# Patient Record
Sex: Male | Born: 1965 | Race: White | Hispanic: No | Marital: Married | Smoking: Never smoker
Health system: Southern US, Community
[De-identification: ages and names within clinical notes are randomized; demographics above are authoritative.]

## PROBLEM LIST (undated history)

## (undated) DIAGNOSIS — G8929 Other chronic pain: Secondary | ICD-10-CM

## (undated) DIAGNOSIS — I10 Essential (primary) hypertension: Secondary | ICD-10-CM

## (undated) HISTORY — PX: ULNAR NERVE REPAIR: SHX2594

---

## 2005-11-08 ENCOUNTER — Ambulatory Visit: Payer: Self-pay | Admitting: Family Medicine

## 2009-01-17 ENCOUNTER — Ambulatory Visit: Payer: Self-pay | Admitting: Internal Medicine

## 2009-02-10 ENCOUNTER — Ambulatory Visit: Payer: Self-pay | Admitting: Internal Medicine

## 2009-02-17 ENCOUNTER — Ambulatory Visit: Payer: Self-pay | Admitting: Internal Medicine

## 2009-03-17 ENCOUNTER — Ambulatory Visit: Payer: Self-pay | Admitting: Internal Medicine

## 2009-05-17 ENCOUNTER — Ambulatory Visit: Payer: Self-pay | Admitting: Internal Medicine

## 2009-05-19 ENCOUNTER — Ambulatory Visit: Payer: Self-pay | Admitting: Internal Medicine

## 2009-06-17 ENCOUNTER — Ambulatory Visit: Payer: Self-pay | Admitting: Internal Medicine

## 2010-04-13 ENCOUNTER — Emergency Department: Payer: Self-pay | Admitting: Emergency Medicine

## 2013-11-25 ENCOUNTER — Ambulatory Visit: Payer: Self-pay

## 2015-09-17 IMAGING — CR CERVICAL SPINE - 2-3 VIEW
1 series · 4 of 4 positions shown · non-contrast
Comparison: None.

CLINICAL DATA: Neck pain times 2-3 years.  No injury.

EXAM:
CERVICAL SPINE - 2-3 VIEW

[Series 1: dxr c- spine ap and lateral · 0.14mm/px · 4 of 4 slices shown]
[im 1/4]
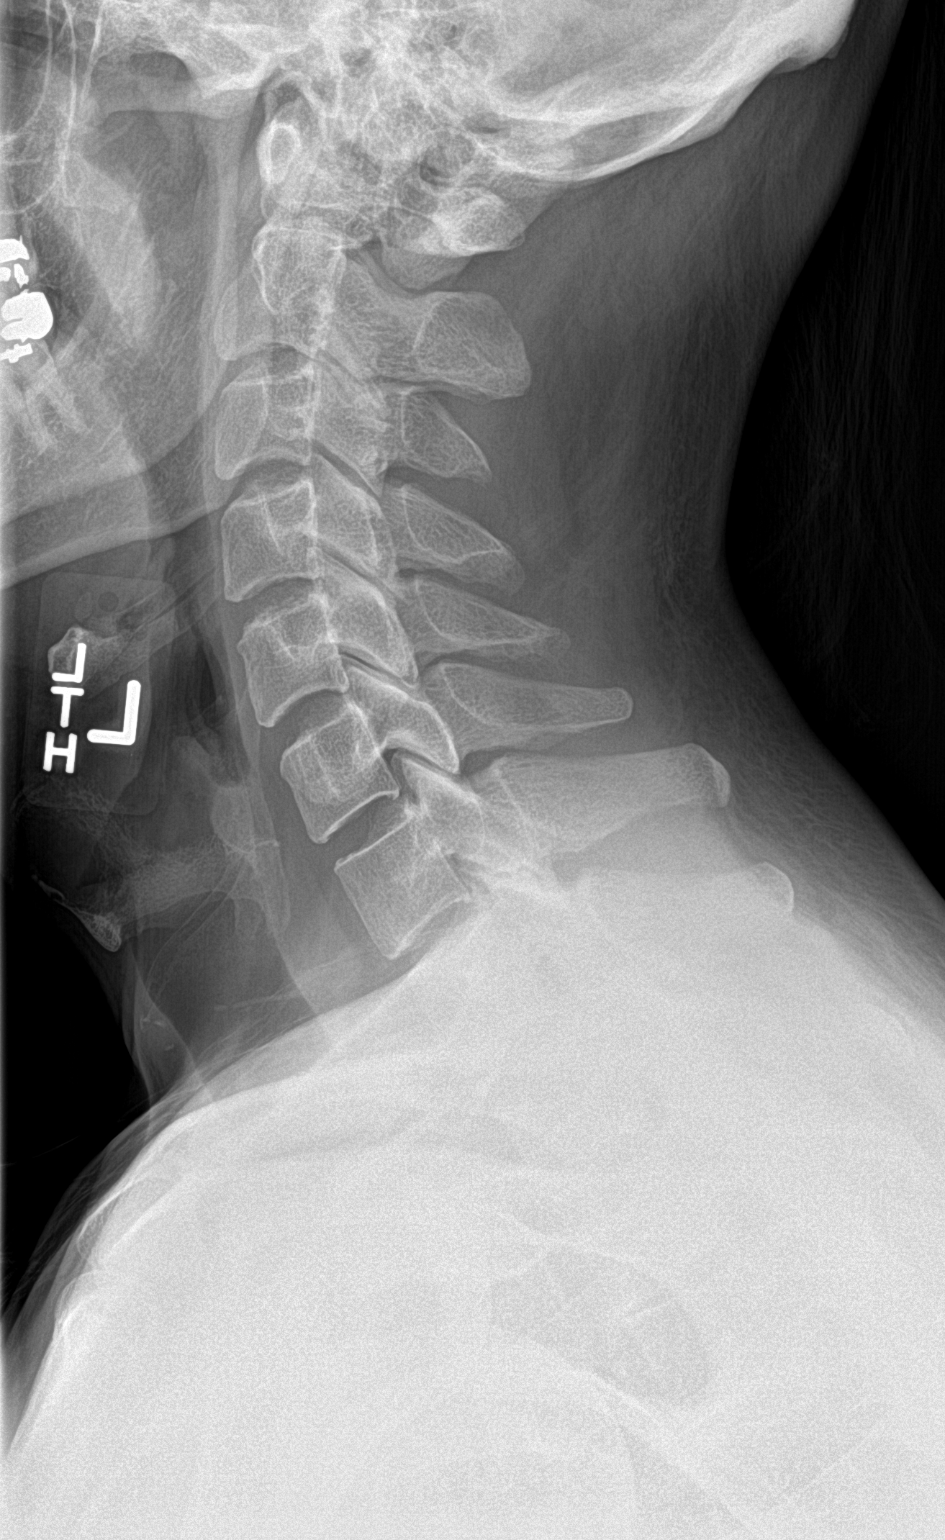
[im 2/4]
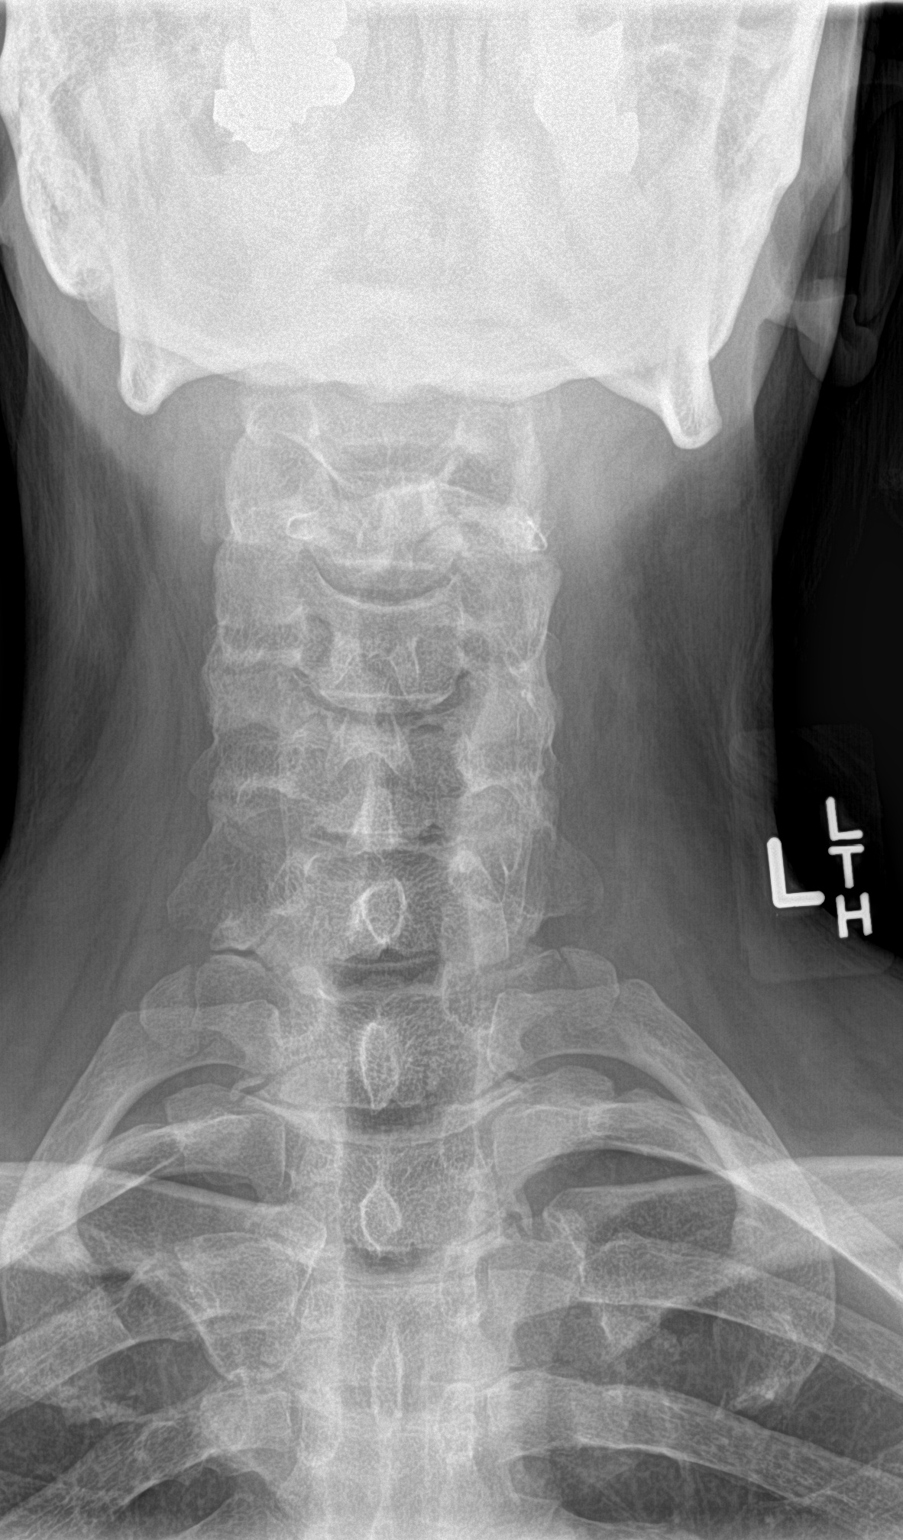
[im 3/4]
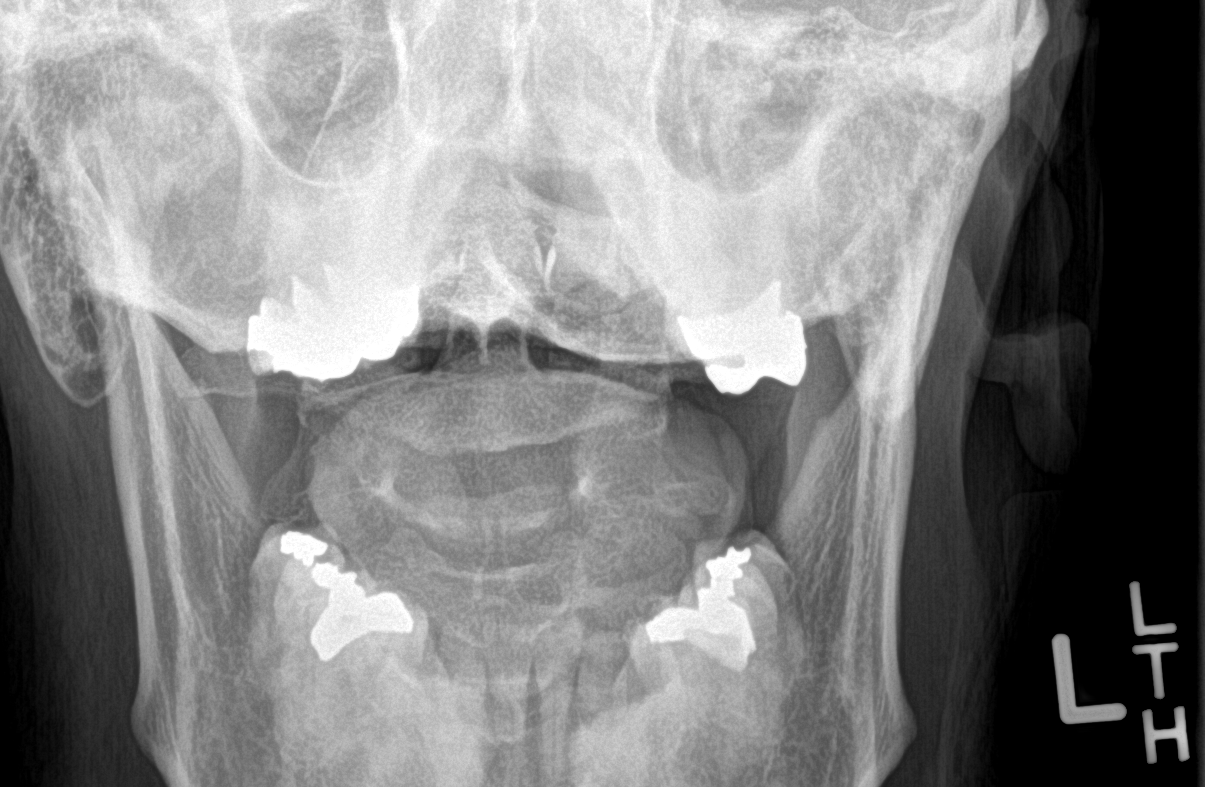
[im 4/4]
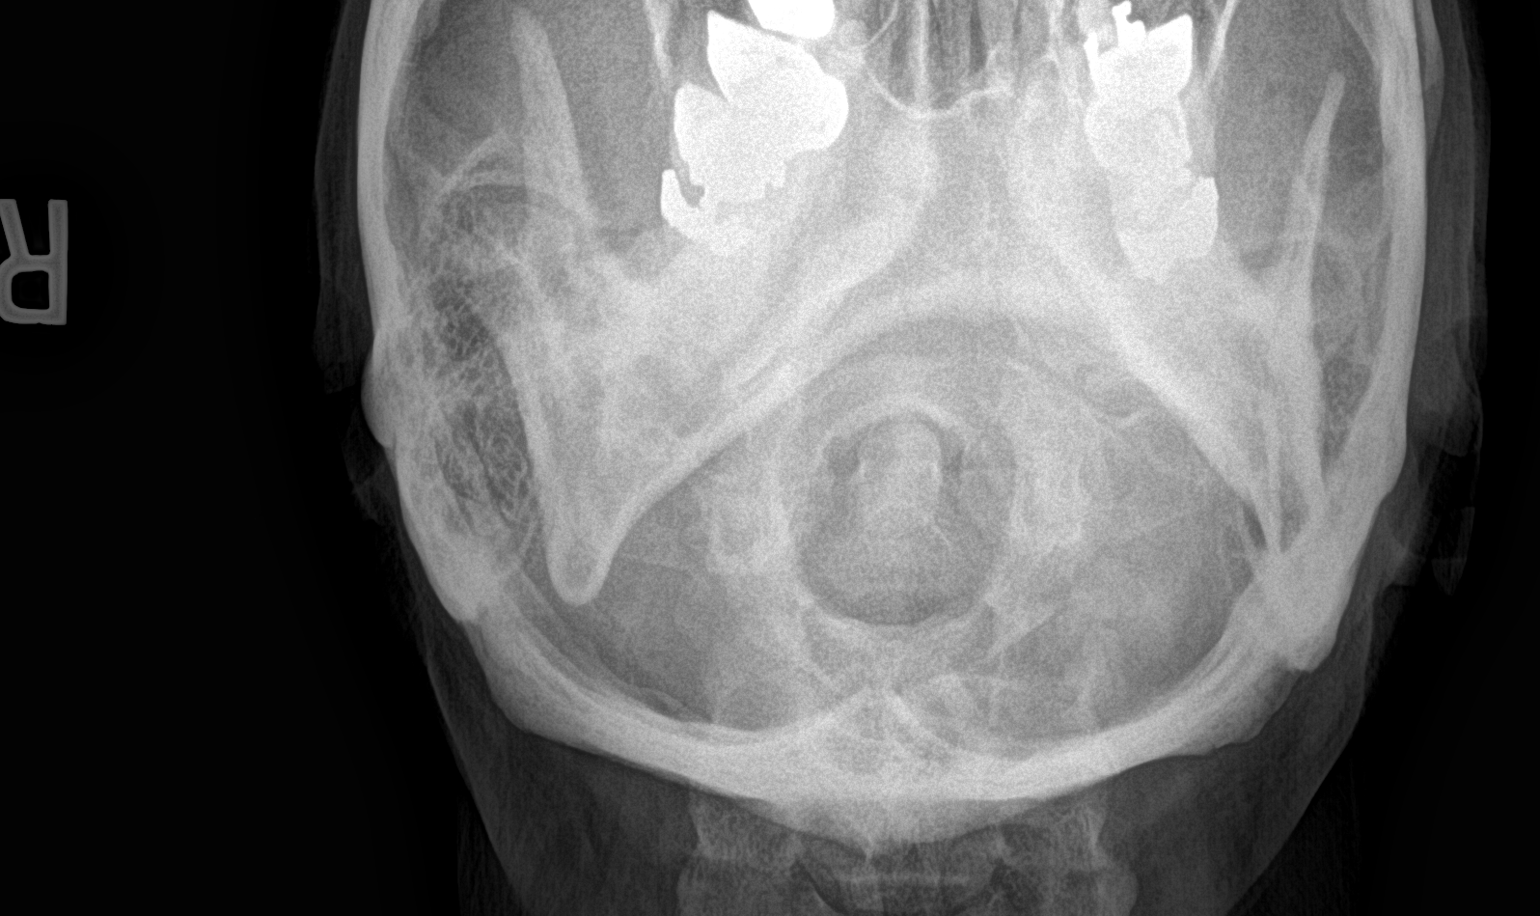

[4 of 4 positions shown; findings below may reference images not displayed]

FINDINGS: No acute soft tissue or bony abnormality identified. Normal
alignment and mineralization. No acute bony abnormality. Pulmonary
apices are clear.
IMPRESSION: No acute abnormality.

## 2017-06-07 ENCOUNTER — Inpatient Hospital Stay: Payer: Self-pay | Admitting: Internal Medicine

## 2021-09-22 ENCOUNTER — Ambulatory Visit: Payer: Medicare HMO | Admitting: Certified Registered Nurse Anesthetist

## 2021-09-22 ENCOUNTER — Ambulatory Visit
Admission: RE | Admit: 2021-09-22 | Discharge: 2021-09-22 | Disposition: A | Discharge status: Home / Self Care | Payer: Medicare HMO | Attending: Internal Medicine | Admitting: Internal Medicine

## 2021-09-22 ENCOUNTER — Encounter
Admission: RE | Disposition: A | Discharge status: Home / Self Care | Payer: Self-pay | Source: Home / Self Care | Attending: Internal Medicine

## 2021-09-22 ENCOUNTER — Encounter: Payer: Self-pay | Admitting: Internal Medicine

## 2021-09-22 DIAGNOSIS — G8929 Other chronic pain: Secondary | ICD-10-CM | POA: Diagnosis not present

## 2021-09-22 DIAGNOSIS — R195 Other fecal abnormalities: Secondary | ICD-10-CM | POA: Diagnosis not present

## 2021-09-22 DIAGNOSIS — M545 Low back pain, unspecified: Secondary | ICD-10-CM | POA: Diagnosis not present

## 2021-09-22 DIAGNOSIS — I10 Essential (primary) hypertension: Secondary | ICD-10-CM | POA: Diagnosis not present

## 2021-09-22 DIAGNOSIS — Z1211 Encounter for screening for malignant neoplasm of colon: Secondary | ICD-10-CM | POA: Diagnosis present

## 2021-09-22 HISTORY — PX: COLONOSCOPY: SHX5424

## 2021-09-22 HISTORY — DX: Essential (primary) hypertension: I10

## 2021-09-22 HISTORY — DX: Other chronic pain: G89.29

## 2021-09-22 SURGERY — COLONOSCOPY
Anesthesia: General

## 2021-09-22 MED ORDER — PROPOFOL 500 MG/50ML IV EMUL
INTRAVENOUS | Status: DC | PRN
  Administered 2021-09-22: 150 ug/kg/min via INTRAVENOUS

## 2021-09-22 MED ORDER — LIDOCAINE HCL (CARDIAC) PF 100 MG/5ML IV SOSY
PREFILLED_SYRINGE | INTRAVENOUS | Status: DC | PRN
  Administered 2021-09-22: 50 mg via INTRAVENOUS

## 2021-09-22 MED ORDER — PROPOFOL 10 MG/ML IV BOLUS
INTRAVENOUS | Status: DC | PRN
  Administered 2021-09-22: 70 mg via INTRAVENOUS

## 2021-09-22 MED ORDER — SODIUM CHLORIDE 0.9 % IV SOLN: INTRAVENOUS | Status: DC

## 2021-09-22 NOTE — H&P (Signed)
  Outpatient short stay form Pre-procedure 09/22/2021 3:21 PM Thomas Fuller, M.D.  Primary Physician: Jerl Mina, M.D.  Reason for visit:  Positive Cologuard test  History of present illness:  Patient is a pleasant 56 y/o male/male presenting on referral from primary care provider for a POSITIVE Cologuard result. Patient denies change in bowel habits, rectal bleeding, weight loss or abdominal pain.       Current Facility-Administered Medications:    0.9 %  sodium chloride infusion, , Intravenous, Continuous, Cochrane, Boykin Nearing, MD, Last Rate: 20 mL/hr at 09/22/21 1514, New Bag at 09/22/21 1514  Medications Prior to Admission  Medication Sig Dispense Refill Last Dose   aspirin EC 81 MG tablet Take 81 mg by mouth daily. Swallow whole.   Past Week   HYDROcodone-acetaminophen (NORCO) 10-325 MG tablet Take 1 tablet by mouth 4 (four) times daily as needed.   09/21/2021   lisinopril (ZESTRIL) 20 MG tablet Take 20 mg by mouth daily.   09/21/2021   methadone (DOLOPHINE) 10 MG tablet Take 10 mg by mouth 4 (four) times daily as needed.   09/21/2021   mirtazapine (REMERON) 30 MG tablet Take 30 mg by mouth at bedtime.   09/21/2021     Allergies  Allergen Reactions   Skelaxin [Metaxalone] Hives     Past Medical History:  Diagnosis Date   Chronic low back pain    facet joint degenerative disc disease   Hypertension     Review of systems:  Otherwise negative.    Physical Exam  Gen: Alert, oriented. Appears stated age.  HEENT: [place]/AT. PERRLA. Lungs: CTA, no wheezes. CV: RR nl S1, S2. Abd: soft, benign, no masses. BS+ Ext: No edema. Pulses 2+    Planned procedures: Proceed with colonoscopy. The patient understands the nature of the planned procedure, indications, risks, alternatives and potential complications including but not limited to bleeding, infection, perforation, damage to internal organs and possible oversedation/side effects from anesthesia. The patient agrees and gives  consent to proceed.  Please refer to procedure notes for findings, recommendations and patient disposition/instructions.     Terez Montee K. Norma Fuller, M.D. Gastroenterology 09/22/2021  3:21 PM

## 2021-09-22 NOTE — Interval H&P Note (Signed)
History and Physical Interval Note:  09/22/2021 3:22 PM  Thomas Fuller  has presented today for surgery, with the diagnosis of Positive colorectal cancer screening using Cologuard test (R19.5).  The various methods of treatment have been discussed with the patient and family. After consideration of risks, benefits and other options for treatment, the patient has consented to  Procedure(s): COLONOSCOPY (N/A) as a surgical intervention.  The patient's history has been reviewed, patient examined, no change in status, stable for surgery.  I have reviewed the patient's chart and labs.  Questions were answered to the patient's satisfaction.     [place], [place]

## 2021-09-22 NOTE — Anesthesia Postprocedure Evaluation (Signed)
Anesthesia Post Note  Patient: Thomas Fuller  Procedure(s) Performed: COLONOSCOPY  Patient location during evaluation: Endoscopy Anesthesia Type: General Level of consciousness: awake and alert Pain management: pain level controlled Vital Signs Assessment: post-procedure vital signs reviewed and stable Respiratory status: spontaneous breathing, nonlabored ventilation, respiratory function stable and patient connected to nasal cannula oxygen Cardiovascular status: blood pressure returned to baseline and stable Postop Assessment: no apparent nausea or vomiting Anesthetic complications: no   No notable events documented.   Last Vitals:  Vitals:   09/22/21 1550 09/22/21 1600  BP: 122/66 (!) 141/83  Pulse: 74   Resp: (!) 21   Temp: 36.8 C   SpO2: 99%     Last Pain:  Vitals:   09/22/21 1600  TempSrc:   PainSc: 0-No pain                 Cleda Mccreedy Frank Novelo

## 2021-09-22 NOTE — Op Note (Signed)
[facility] Gastroenterology Patient Name: Thomas Fuller Procedure Date: 09/22/2021 3:31 PM MRN: [id number] Account #: [id number] Date of Birth: 05-Sep-1965 Admit Type: Outpatient Age: 56 Room: [facility] ENDO ROOM 2 Gender: Male Note Status: Finalized Instrument Name: Colonoscope [id number] Procedure:             Colonoscopy Indications:           Positive Cologuard test Providers:             Royce Macadamia K. Emmanuel Ercole MD, MD Medicines:             Propofol per Anesthesia Complications:         No immediate complications. Procedure:             Pre-Anesthesia Assessment:                        - The risks and benefits of the procedure and the                         sedation options and risks were discussed with the                         patient. All questions were answered and informed                         consent was obtained.                        - Patient identification and proposed procedure were                         verified prior to the procedure by the nurse. The                         procedure was verified in the procedure room.                        - ASA Grade Assessment: II - A patient with mild                         systemic disease.                        - After reviewing the risks and benefits, the patient                         was deemed in satisfactory condition to undergo the                         procedure.                        After obtaining informed consent, the colonoscope was                         passed under direct vision. Throughout the procedure,                         the patient's blood pressure, pulse, and oxygen  saturations were monitored continuously. The                         Colonoscope was introduced through the anus and                         advanced to the the cecum, identified by appendiceal                         orifice and ileocecal valve. The colonoscopy was                          performed without difficulty. The patient tolerated                         the procedure well. The quality of the bowel                         preparation was good. The ileocecal valve, appendiceal                         orifice, and rectum were photographed. Findings:      The perianal and digital rectal examinations were normal. Pertinent       negatives include normal sphincter tone, no palpable rectal lesions and       normal prostate (size, shape, and consistency).      The entire examined colon appeared normal on direct and retroflexion       views. Impression:            - The entire examined colon is normal on direct and                         retroflexion views.                        - No specimens collected. Recommendation:        - Patient has a contact number available for                         emergencies. The signs and symptoms of potential                         delayed complications were discussed with the patient.                         Return to normal activities tomorrow. Written                         discharge instructions were provided to the patient.                        - Resume previous diet.                        - Continue present medications.                        - Repeat colonoscopy in 10 years for screening  purposes.                        - Return to GI office PRN.                        - The findings and recommendations were discussed with                         the patient. Procedure Code(s):     --- Professional ---                        [phone], Colonoscopy, flexible; diagnostic, including                         collection of specimen(s) by brushing or washing, when                         performed (separate procedure) Diagnosis Code(s):     --- Professional ---                        R19.5, Other fecal abnormalities CPT copyright 2019 American Medical Association. All rights reserved. The codes documented  in this report are preliminary and upon coder review may  be revised to meet current compliance requirements. Stanton Kidney MD, MD 09/22/2021 3:47:06 PM This report has been signed electronically. Number of Addenda: 0 Note Initiated On: 09/22/2021 3:31 PM Scope Withdrawal Time: 0 hours 5 minutes 28 seconds  Total Procedure Duration: 0 hours 9 minutes 5 seconds  Estimated Blood Loss:  Estimated blood loss: none.      [facility]

## 2021-09-22 NOTE — Transfer of Care (Signed)
Immediate Anesthesia Transfer of Care Note  Patient: Thomas Fuller  Procedure(s) Performed: COLONOSCOPY  Patient Location: PACU  Anesthesia Type:General  Level of Consciousness: awake and alert   Airway & Oxygen Therapy: Patient Spontanous Breathing  Post-op Assessment: Report given to RN and Post -op Vital signs reviewed and stable  Post vital signs: Reviewed and stable  Last Vitals:  Vitals Value Taken Time  BP 122/66 09/22/21 1550  Temp    Pulse 74 09/22/21 1550  Resp 11 09/22/21 1550  SpO2 99 % 09/22/21 1550    Last Pain:  Vitals:   09/22/21 1500  TempSrc: Temporal  PainSc: 4          Complications: No notable events documented.

## 2021-09-22 NOTE — Anesthesia Procedure Notes (Signed)
Date/Time: 09/22/2021 3:31 PM  Performed by: Ginger Carne, CRNAPre-anesthesia Checklist: Patient identified, Emergency Drugs available, Suction available, Patient being monitored and Timeout performed Patient Re-evaluated:Patient Re-evaluated prior to induction Oxygen Delivery Method: Nasal cannula Preoxygenation: Pre-oxygenation with 100% oxygen Induction Type: IV induction

## 2021-09-22 NOTE — Anesthesia Preprocedure Evaluation (Signed)
Anesthesia Evaluation  Patient identified by MRN, date of birth, ID band Patient awake    Reviewed: Allergy & Precautions, NPO status , Patient's Chart, lab work & pertinent test results  History of Anesthesia Complications Negative for: history of anesthetic complications  Airway Mallampati: III  TM Distance: >3 FB Neck ROM: full    Dental  (+) Chipped, Poor Dentition, Missing, Implants   Pulmonary neg pulmonary ROS, neg shortness of breath,    Pulmonary exam normal        Cardiovascular Exercise Tolerance: Good hypertension, (-) anginaNormal cardiovascular exam     Neuro/Psych negative neurological ROS  negative psych ROS   GI/Hepatic negative GI ROS, Neg liver ROS, neg GERD  ,  Endo/Other  negative endocrine ROS  Renal/GU negative Renal ROS  negative genitourinary   Musculoskeletal   Abdominal   Peds  Hematology negative hematology ROS (+)   Anesthesia Other Findings Past Medical History: No date: Chronic low back pain     Comment:  facet joint degenerative disc disease No date: Hypertension  Past Surgical History: No date: ULNAR NERVE REPAIR; Right     Reproductive/Obstetrics negative OB ROS                             Anesthesia Physical Anesthesia Plan  ASA: 2  Anesthesia Plan: General   Post-op Pain Management:    Induction: Intravenous  PONV Risk Score and Plan: Propofol infusion and TIVA  Airway Management Planned: Natural Airway and Nasal Cannula  Additional Equipment:   Intra-op Plan:   Post-operative Plan:   Informed Consent: I have reviewed the patients History and Physical, chart, labs and discussed the procedure including the risks, benefits and alternatives for the proposed anesthesia with the patient or authorized representative who has indicated his/her understanding and acceptance.     Dental Advisory Given  Plan Discussed with:  Anesthesiologist, CRNA and Surgeon  Anesthesia Plan Comments: (Patient consented for risks of anesthesia including but not limited to:  - adverse reactions to medications - risk of airway placement if required - damage to eyes, teeth, lips or other oral mucosa - nerve damage due to positioning  - sore throat or hoarseness - Damage to heart, brain, nerves, lungs, other parts of body or loss of life  Patient voiced understanding.)        Anesthesia Quick Evaluation

## 2021-09-23 ENCOUNTER — Encounter: Payer: Self-pay | Admitting: Internal Medicine

## 2023-03-08 ENCOUNTER — Other Ambulatory Visit: Payer: Self-pay | Admitting: Internal Medicine

## 2023-03-08 DIAGNOSIS — I10 Essential (primary) hypertension: Secondary | ICD-10-CM

## 2023-03-16 ENCOUNTER — Ambulatory Visit
Admission: RE | Admit: 2023-03-16 | Discharge: 2023-03-16 | Disposition: A | Discharge status: Home / Self Care | Payer: Self-pay | Source: Ambulatory Visit | Attending: Internal Medicine | Admitting: Internal Medicine

## 2023-03-16 DIAGNOSIS — I10 Essential (primary) hypertension: Secondary | ICD-10-CM | POA: Insufficient documentation
